# Patient Record
Sex: Female | Born: 1980 | Race: White | Hispanic: No | Marital: Married | Smoking: Never smoker
Health system: Southern US, Community
[De-identification: ages and names within clinical notes are randomized; demographics above are authoritative.]

---

## 2005-11-19 ENCOUNTER — Other Ambulatory Visit: Admission: RE | Admit: 2005-11-19 | Discharge: 2005-11-19 | Payer: Self-pay | Admitting: Obstetrics and Gynecology

## 2007-12-02 ENCOUNTER — Inpatient Hospital Stay (HOSPITAL_COMMUNITY): Admission: AD | Admit: 2007-12-02 | Discharge: 2007-12-04 | Payer: Self-pay | Admitting: Obstetrics and Gynecology

## 2010-08-20 ENCOUNTER — Ambulatory Visit (INDEPENDENT_AMBULATORY_CARE_PROVIDER_SITE_OTHER): Payer: 59

## 2010-08-20 ENCOUNTER — Inpatient Hospital Stay (INDEPENDENT_AMBULATORY_CARE_PROVIDER_SITE_OTHER)
Admission: RE | Admit: 2010-08-20 | Discharge: 2010-08-20 | Disposition: A | Discharge status: Home / Self Care | Payer: 59 | Source: Ambulatory Visit | Attending: Family Medicine | Admitting: Family Medicine

## 2010-08-20 DIAGNOSIS — S93409A Sprain of unspecified ligament of unspecified ankle, initial encounter: Secondary | ICD-10-CM

## 2012-08-31 IMAGING — CR DG FOOT COMPLETE 3+V*L*
3 series · 3 of 3 positions shown · non-contrast
Comparison: None.

CLINICAL DATA: Left foot pain.

LEFT FOOT - COMPLETE 3+ VIEW

[view not recorded (1 of 3)]
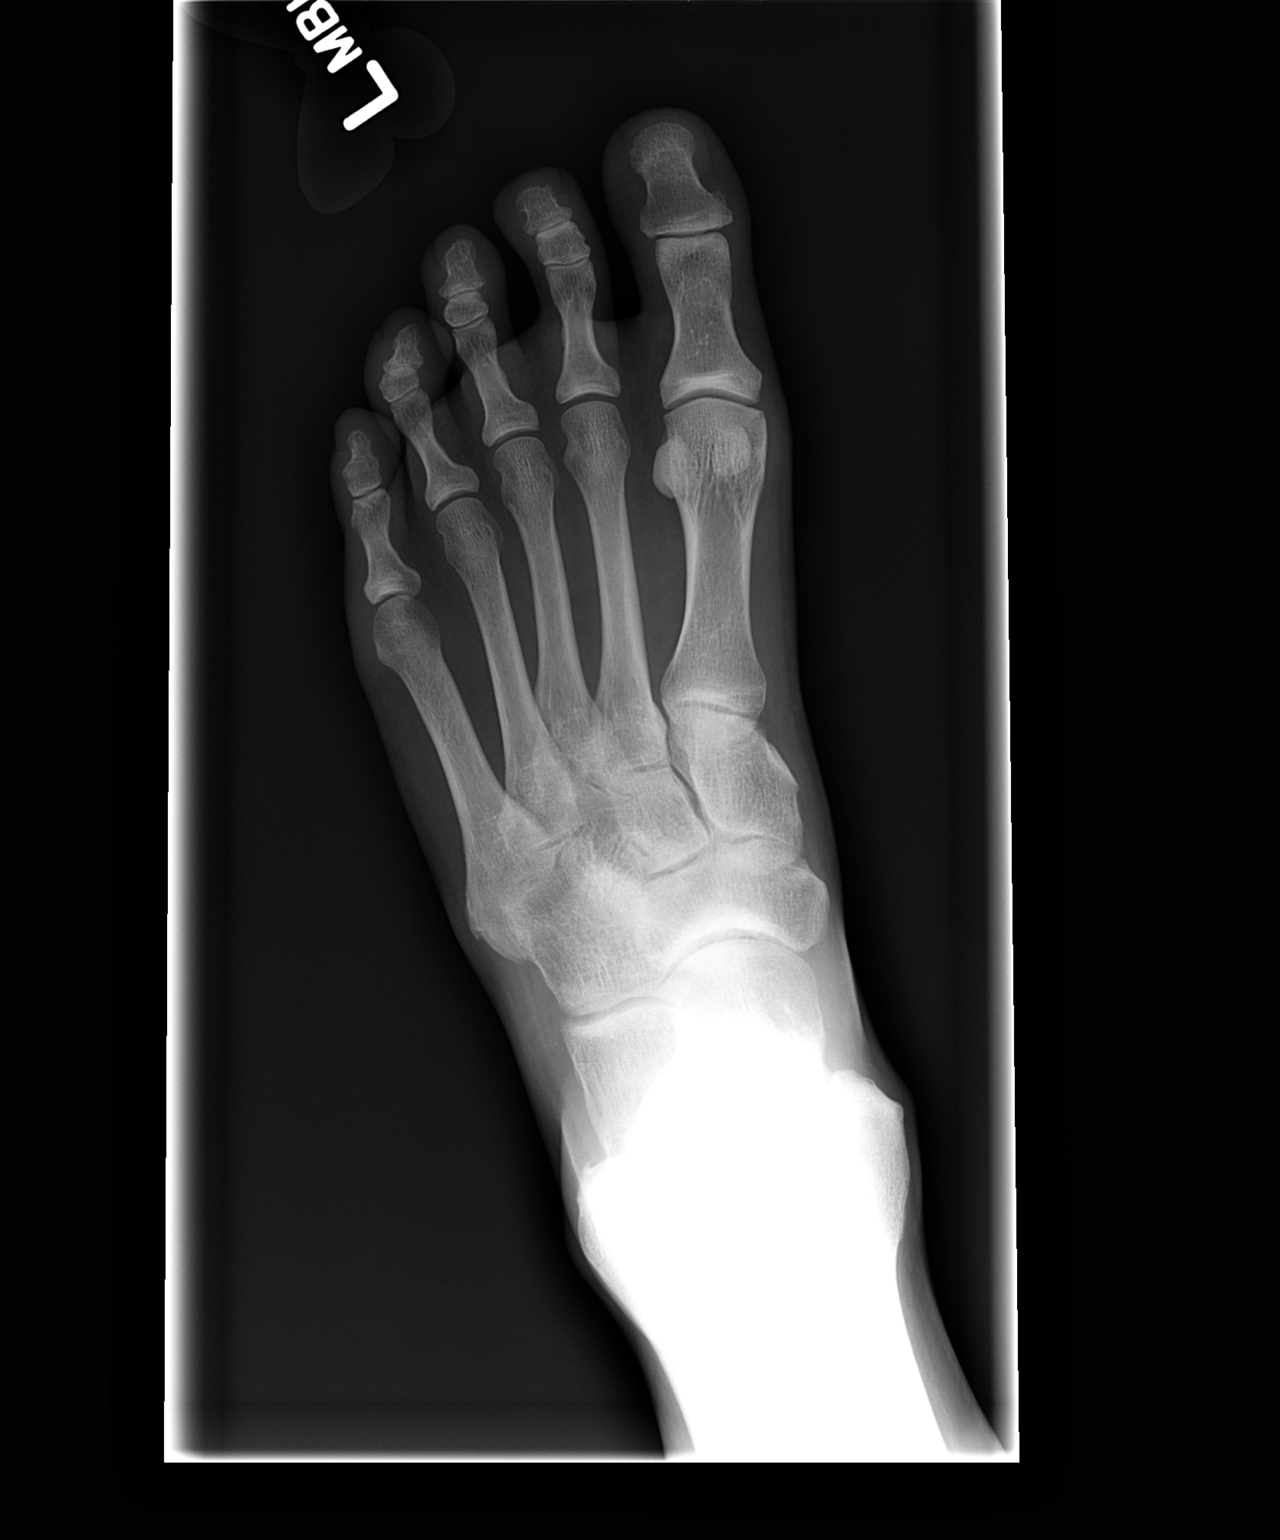

[view not recorded (2 of 3)]
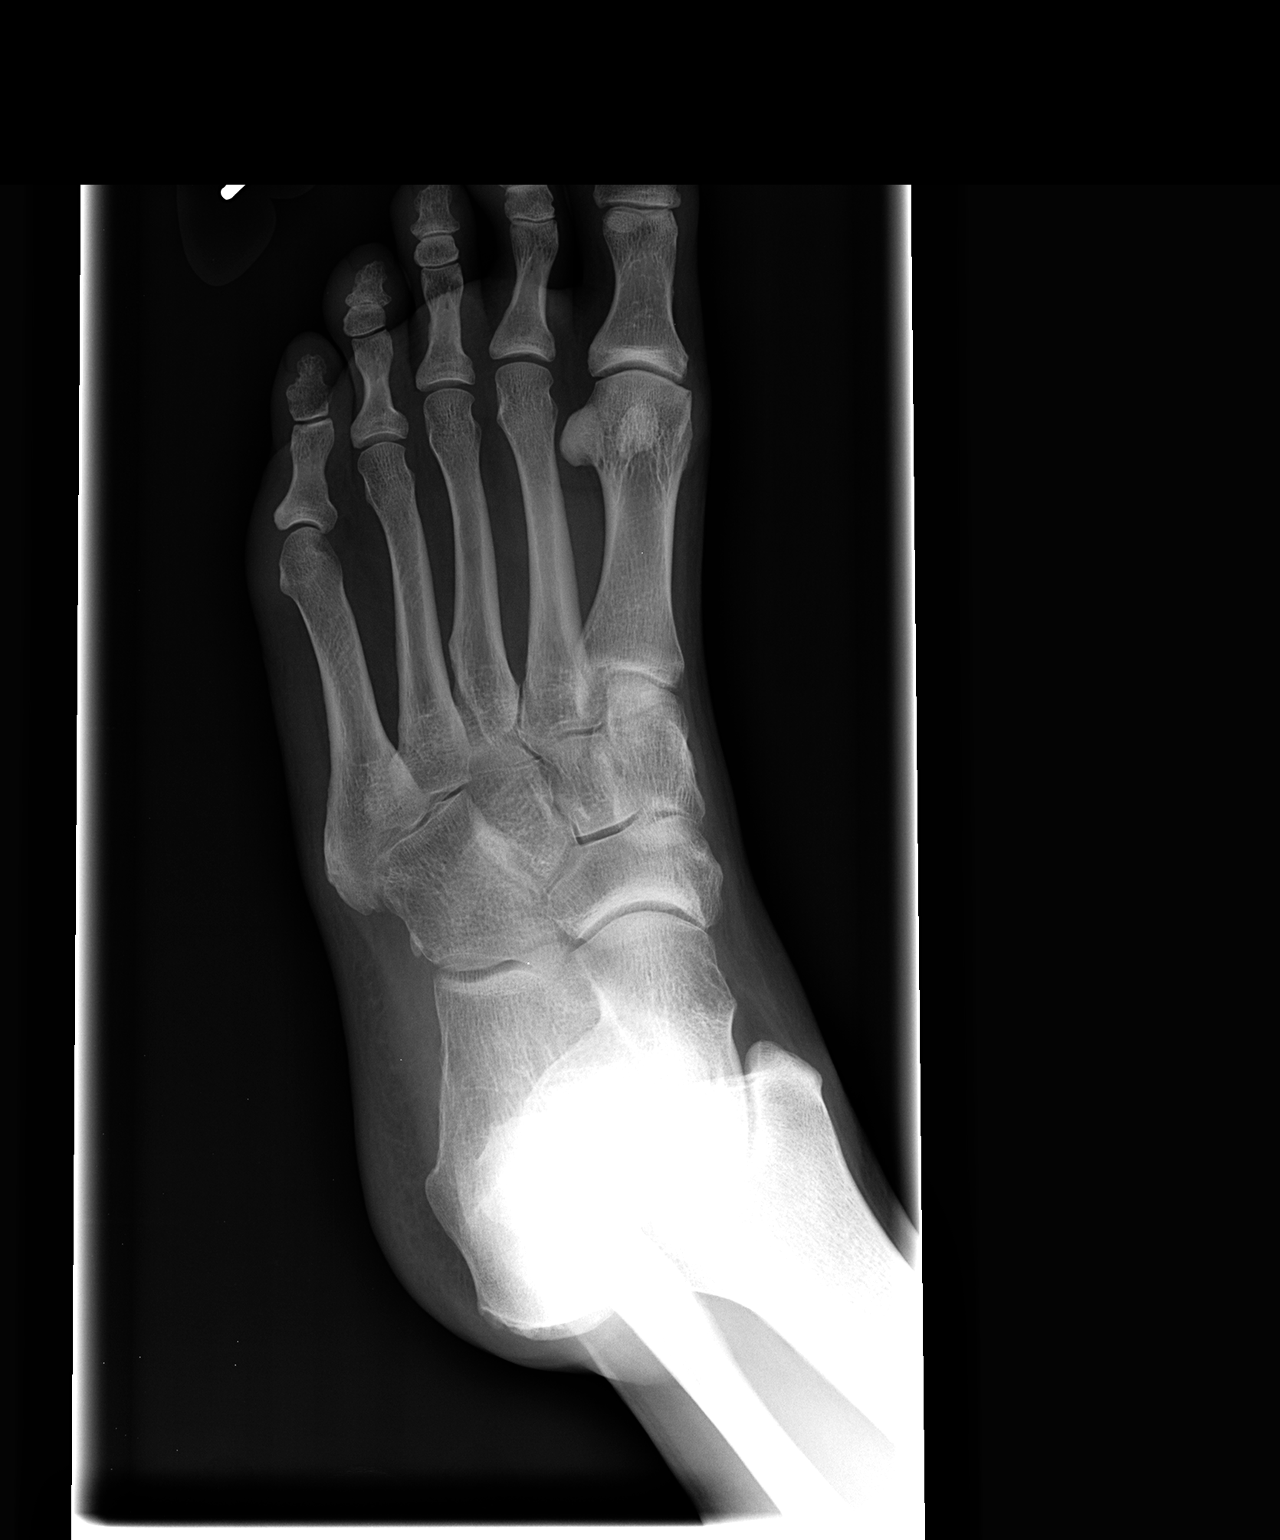

[view not recorded (3 of 3)]
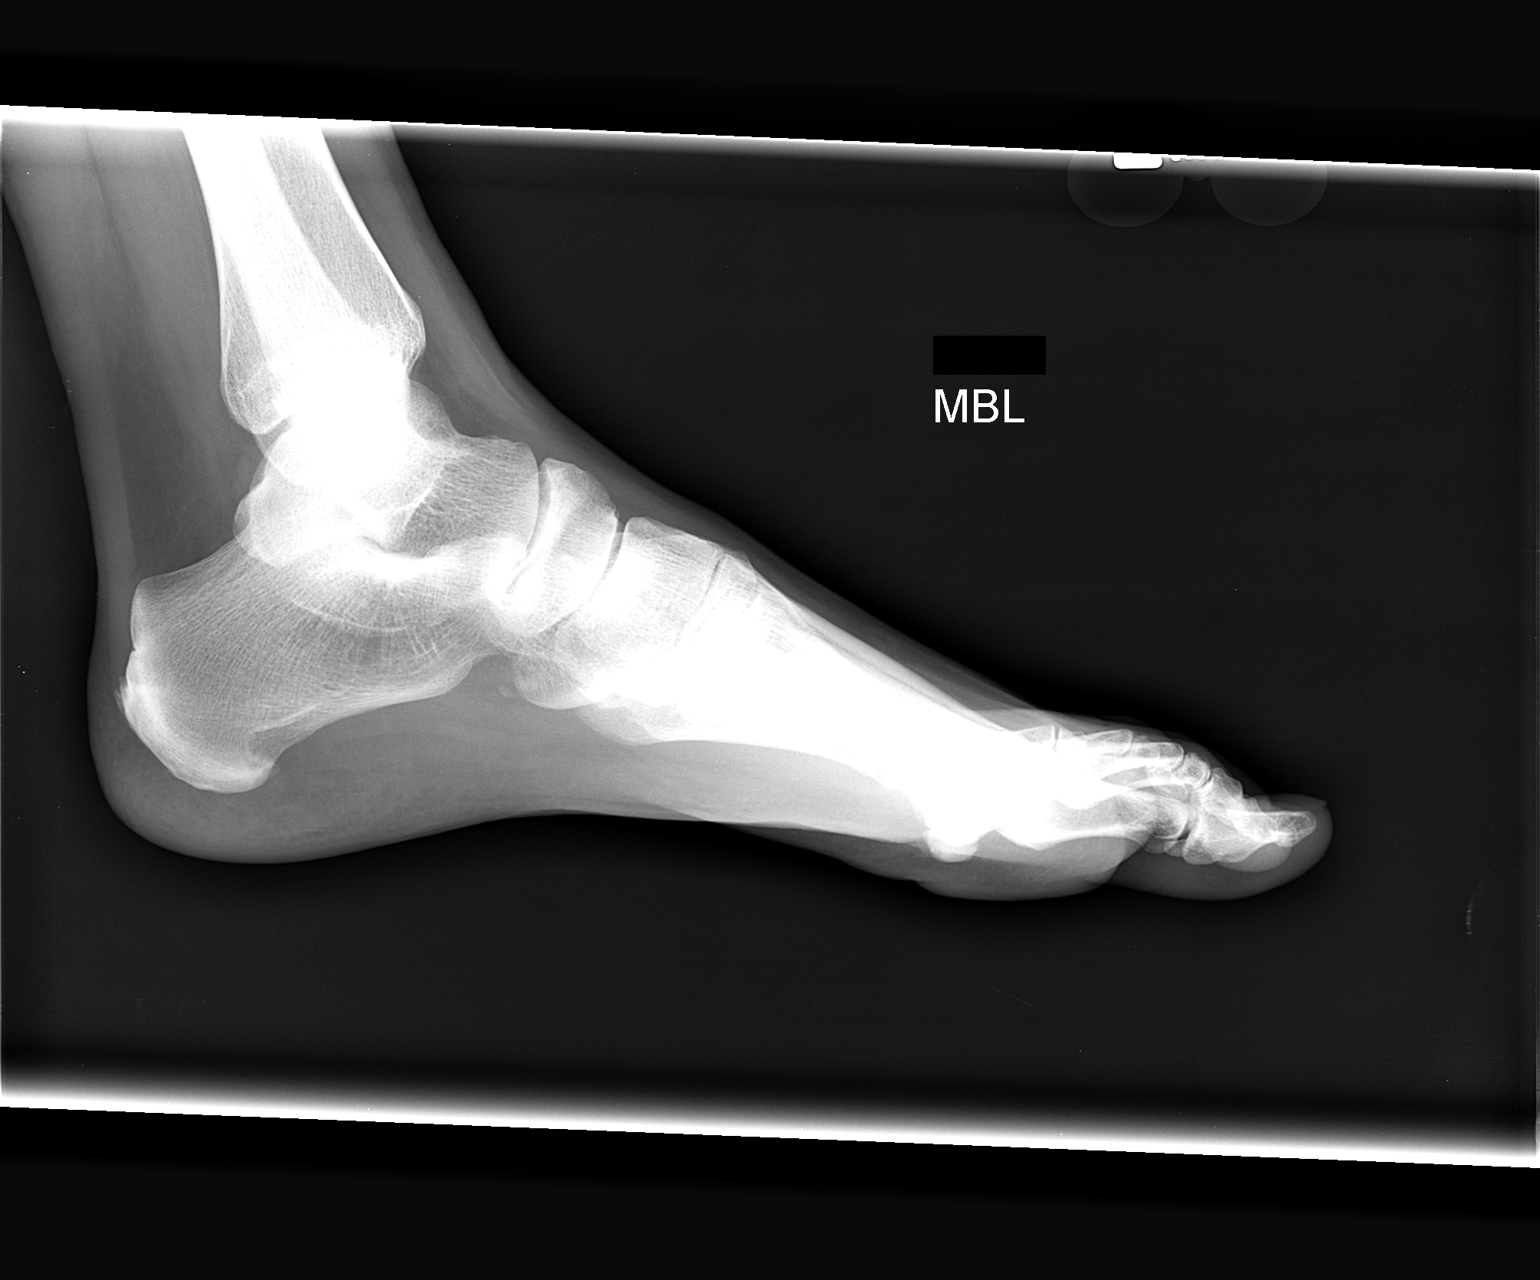

[3 of 3 positions shown; findings below may reference images not displayed]

FINDINGS: The joint spaces are maintained.  No acute fracture.  No
degenerative changes.
IMPRESSION: No acute bony findings.

## 2012-11-06 ENCOUNTER — Other Ambulatory Visit (HOSPITAL_COMMUNITY)
Admission: RE | Admit: 2012-11-06 | Discharge: 2012-11-06 | Disposition: A | Discharge status: Home / Self Care | Payer: Medicaid Other | Source: Ambulatory Visit | Attending: Family Medicine | Admitting: Family Medicine

## 2012-11-06 ENCOUNTER — Other Ambulatory Visit: Payer: Self-pay | Admitting: Physician Assistant

## 2012-11-06 DIAGNOSIS — Z0000 Encounter for general adult medical examination without abnormal findings: Secondary | ICD-10-CM | POA: Insufficient documentation

## 2012-11-06 DIAGNOSIS — R102 Pelvic and perineal pain: Secondary | ICD-10-CM

## 2012-11-11 ENCOUNTER — Ambulatory Visit
Admission: RE | Admit: 2012-11-11 | Discharge: 2012-11-11 | Disposition: A | Discharge status: Home / Self Care | Payer: Medicaid Other | Source: Ambulatory Visit | Attending: Physician Assistant | Admitting: Physician Assistant

## 2012-11-11 DIAGNOSIS — R102 Pelvic and perineal pain: Secondary | ICD-10-CM

## 2019-04-22 ENCOUNTER — Ambulatory Visit: Payer: Self-pay | Attending: Internal Medicine

## 2019-04-22 DIAGNOSIS — Z20822 Contact with and (suspected) exposure to covid-19: Secondary | ICD-10-CM | POA: Insufficient documentation

## 2019-04-23 LAB — NOVEL CORONAVIRUS, NAA: SARS-CoV-2, NAA: NOT DETECTED

## 2020-10-04 ENCOUNTER — Other Ambulatory Visit: Payer: Self-pay

## 2020-10-04 ENCOUNTER — Encounter: Payer: Self-pay | Admitting: Emergency Medicine

## 2020-10-04 ENCOUNTER — Ambulatory Visit
Admission: EM | Admit: 2020-10-04 | Discharge: 2020-10-04 | Disposition: A | Discharge status: Home / Self Care | Payer: Self-pay | Attending: Emergency Medicine | Admitting: Emergency Medicine

## 2020-10-04 DIAGNOSIS — U071 COVID-19: Secondary | ICD-10-CM

## 2020-10-04 DIAGNOSIS — R509 Fever, unspecified: Secondary | ICD-10-CM

## 2020-10-04 DIAGNOSIS — R059 Cough, unspecified: Secondary | ICD-10-CM

## 2020-10-04 MED ORDER — MOLNUPIRAVIR EUA 200MG CAPSULE: 4.0000 | ORAL_CAPSULE | Freq: Two times a day (BID) | ORAL | 0 refills | Status: AC

## 2020-10-04 MED ORDER — PREDNISONE 10 MG (21) PO TBPK: ORAL_TABLET | Freq: Every day | ORAL | 0 refills | Status: AC

## 2020-10-04 MED ORDER — ONDANSETRON HCL 4 MG PO TABS: 4.0000 mg | ORAL_TABLET | Freq: Four times a day (QID) | ORAL | 0 refills | Status: AC

## 2020-10-04 MED ORDER — ALBUTEROL SULFATE HFA 108 (90 BASE) MCG/ACT IN AERS: 1.0000 | INHALATION_SPRAY | Freq: Four times a day (QID) | RESPIRATORY_TRACT | 0 refills | Status: AC | PRN

## 2020-10-04 NOTE — ED Triage Notes (Signed)
Pt here for cough, congestion and vomiting x 4 days; pt sts positive covid test

## 2020-10-04 NOTE — ED Provider Notes (Signed)
[facility]    CSN: [id number] Arrival date & time: 10/04/20  1241      History   Chief Complaint Chief Complaint  Patient presents with   Cough   Vomiting    HPI Elizabeth Parks is a 40 y.o. female.   Pt tested positive for covid on sun, states she feels bad, coughing phlem, body aches, n/v/ fever. Not able to keep motrin down. Has take otc meds with no relief. No chest pain, some sob.  Pt has taken JJ vaccine    History reviewed. No pertinent past medical history.  There are no problems to display for this patient.   History reviewed. No pertinent surgical history.  OB History   No obstetric history on file.      Home Medications    Prior to Admission medications   Not on File    Family History History reviewed. No pertinent family history.  Social History     Allergies   Patient has no known allergies.   Review of Systems Review of Systems  Constitutional:  Positive for activity change, chills and fever.  HENT:  Positive for sore throat.   Respiratory:  Positive for cough and shortness of breath.   Cardiovascular: Negative.   Gastrointestinal:  Positive for nausea and vomiting.  Genitourinary: Negative.   Musculoskeletal:        Generalized body aches   Neurological:  Positive for weakness and light-headedness.    Physical Exam Triage Vital Signs ED Triage Vitals [10/04/20 1351]  Enc Vitals Group     BP 122/70     Pulse Rate 90     Resp 18     Temp 99.1 F (37.3 C)     Temp Source Oral     SpO2 96 %     Weight      Height      Head Circumference      Peak Flow      Pain Score 7     Pain Loc      Pain Edu?      Excl. in GC?    No data found.  Updated Vital Signs BP 122/70 (BP Location: Left Arm)   Pulse 90   Temp 99.1 F (37.3 C) (Oral)   Resp 18   SpO2 96%   Visual Acuity     Physical Exam Constitutional:      Appearance: She is ill-appearing.  Cardiovascular:     Rate and Rhythm: Tachycardia present.   Pulmonary:     Breath sounds: Rhonchi present.  Abdominal:     General: Abdomen is flat.  Musculoskeletal:        General: Normal range of motion.     Cervical back: Normal range of motion.  Skin:    General: Skin is warm.     Capillary Refill: Capillary refill takes less than 2 seconds.  Neurological:     General: No focal deficit present.     Mental Status: She is alert.     UC Treatments / Results  Labs (all labs ordered are listed, but only abnormal results are displayed) Labs Reviewed - No data to display  EKG   Radiology No results found.  Procedures Procedures (including critical care time)  Medications Ordered in UC Medications - No data to display  Initial Impression / Assessment and Plan / UC Course  I have reviewed the triage vital signs and the nursing notes.  Pertinent labs & imaging results that were available  during my care of the patient were reviewed by me and considered in my medical decision making (see chart for details).     Take medications as prescribed  Stay hydrated  If symptoms become worse go to the er  Quarantine for at least 5 days   Final Clinical Impressions(s) / UC Diagnoses   Final diagnoses:  None   Discharge Instructions   None    ED Prescriptions   None    PDMP not reviewed this encounter.   Coralyn Mark, NP 10/04/20 1534

## 2021-12-12 ENCOUNTER — Other Ambulatory Visit: Payer: Self-pay

## 2021-12-12 ENCOUNTER — Ambulatory Visit (HOSPITAL_COMMUNITY)
Admission: EM | Admit: 2021-12-12 | Discharge: 2021-12-12 | Disposition: A | Discharge status: Home / Self Care | Payer: Self-pay | Attending: Internal Medicine | Admitting: Internal Medicine

## 2021-12-12 ENCOUNTER — Encounter (HOSPITAL_COMMUNITY): Payer: Self-pay | Admitting: Emergency Medicine

## 2021-12-12 DIAGNOSIS — R0789 Other chest pain: Secondary | ICD-10-CM

## 2021-12-12 NOTE — ED Triage Notes (Signed)
Pt reports intermittent chest tightness, shortness of breath and nausea x 3 days. States have tried taking an antacid today with no relief. States on Sunday morning woke up with a headache and nausea similar to feeling like a hangover.

## 2021-12-12 NOTE — ED Provider Notes (Signed)
[facility]    CSN: [id number] Arrival date & time: 12/12/21  1733      History   Chief Complaint Chief Complaint  Patient presents with   Shortness of Breath   Nausea   Intermittent Chest Tightness     HPI Elizabeth Parks is a 41 y.o. female.  Presents with 3-day history of intermittent chest tightness, "shortness of breath" Overall fatigue Denies any symptoms while in clinic currently  No URI symptoms or fever  Tried antacid for symptoms without relief Does report some epigastric discomfort when laying down at night after eating.  No history of asthma or COPD, no cardiac history No PE risk factors   She is traveling tomorrow and just wanted to be seen before going.  History reviewed. No pertinent past medical history.  There are no problems to display for this patient.   History reviewed. No pertinent surgical history.  OB History   No obstetric history on file.      Home Medications    Prior to Admission medications   Medication Sig Start Date End Date Taking? Authorizing Provider  albuterol (VENTOLIN HFA) 108 (90 Base) MCG/ACT inhaler Inhale 1-2 puffs into the lungs every 6 (six) hours as needed for wheezing or shortness of breath. 10/04/20   Coralyn Mark, NP  ondansetron (ZOFRAN) 4 MG tablet Take 1 tablet (4 mg total) by mouth every 6 (six) hours. 10/04/20   Coralyn Mark, NP  predniSONE (STERAPRED UNI-PAK 21 TAB) 10 MG (21) TBPK tablet Take by mouth daily. Take 6 tabs by mouth daily  for 2 days, then 5 tabs for 2 days, then 4 tabs for 2 days, then 3 tabs for 2 days, 2 tabs for 2 days, then 1 tab by mouth daily for 2 days 10/04/20   Coralyn Mark, NP    Family History History reviewed. No pertinent family history.  Social History Social History   Tobacco Use   Smoking status: Never   Smokeless tobacco: Never     Allergies   Morphine   Review of Systems Review of Systems Per HPI  Physical Exam Triage Vital  Signs ED Triage Vitals  Enc Vitals Group     BP 12/12/21 1834 126/81     Pulse Rate 12/12/21 1834 84     Resp 12/12/21 1834 16     Temp 12/12/21 1834 98.3 F (36.8 C)     Temp Source 12/12/21 1834 Oral     SpO2 12/12/21 1834 98 %     Weight --      Height --      Head Circumference --      Peak Flow --      Pain Score 12/12/21 1831 4     Pain Loc --      Pain Edu? --      Excl. in GC? --    No data found.  Updated Vital Signs BP 126/81 (BP Location: Right Arm)   Pulse 84   Temp 98.3 F (36.8 C) (Oral)   Resp 16   SpO2 98%   Physical Exam Vitals and nursing note reviewed.  Constitutional:      General: She is not in acute distress.    Appearance: She is well-developed.  HENT:     Head: Normocephalic and atraumatic.     Mouth/Throat:     Pharynx: Oropharynx is clear.  Eyes:     Conjunctiva/sclera: Conjunctivae normal.  Cardiovascular:     Rate and Rhythm:  Normal rate and regular rhythm.     Pulses: Normal pulses.     Heart sounds: Normal heart sounds.  Pulmonary:     Effort: Pulmonary effort is normal. No respiratory distress.     Breath sounds: Normal breath sounds.  Chest:     Chest wall: No tenderness.  Abdominal:     Palpations: Abdomen is soft.     Tenderness: There is no abdominal tenderness. There is no guarding.  Musculoskeletal:        General: No swelling.     Cervical back: Neck supple.  Skin:    General: Skin is warm and dry.     Capillary Refill: Capillary refill takes less than 2 seconds.  Neurological:     Mental Status: She is alert.  Psychiatric:        Mood and Affect: Mood normal.      UC Treatments / Results  Labs (all labs ordered are listed, but only abnormal results are displayed) Labs Reviewed - No data to display  EKG  Radiology No results found.  Procedures Procedures   Medications Ordered in UC Medications - No data to display  Initial Impression / Assessment and Plan / UC Course  I have reviewed the triage  vital signs and the nursing notes.  Pertinent labs & imaging results that were available during my care of the patient were reviewed by me and considered in my medical decision making (see chart for details).  EKG obtained normal sinus rhythm with ventricular rate of 72 bpm, no ischemic changes. After discussion about shortness of breath sensation, patient declines feeling this way.  She has not had any trouble breathing whatsoever. No acute distress. Patient does not have symptoms in clinic. She is breathing normally with clear lungs. Reports chest tightness feels like her reflux symptoms sometimes. Recommend follow up with PCP regarding symptoms, but strict ED precautions for any chest pain or trouble breathing. Return precautions discussed. Patient agrees to plan  Final Clinical Impressions(s) / UC Diagnoses   Final diagnoses:  Chest wall discomfort     Discharge Instructions      Please follow-up with a primary care provider as soon as you are able to establish.  Please go right to the emergency department if symptoms worsen.     ED Prescriptions   None    PDMP not reviewed this encounter.   Julia Alkhatib, Ray Church 12/12/21 2113

## 2021-12-12 NOTE — Discharge Instructions (Signed)
Please follow-up with a primary care provider as soon as you are able to establish.  Please go right to the emergency department if symptoms worsen.

## 2021-12-14 ENCOUNTER — Encounter: Payer: Self-pay | Admitting: Emergency Medicine

## 2022-11-26 ENCOUNTER — Emergency Department (HOSPITAL_COMMUNITY)
Admission: EM | Admit: 2022-11-26 | Discharge: 2022-11-27 | Disposition: A | Discharge status: Home / Self Care | Payer: Self-pay | Attending: Emergency Medicine | Admitting: Emergency Medicine

## 2022-11-26 DIAGNOSIS — M546 Pain in thoracic spine: Secondary | ICD-10-CM | POA: Insufficient documentation

## 2022-11-26 DIAGNOSIS — R079 Chest pain, unspecified: Secondary | ICD-10-CM | POA: Insufficient documentation

## 2022-11-26 DIAGNOSIS — R0602 Shortness of breath: Secondary | ICD-10-CM | POA: Insufficient documentation

## 2022-11-26 DIAGNOSIS — R0789 Other chest pain: Secondary | ICD-10-CM

## 2022-11-27 ENCOUNTER — Encounter (HOSPITAL_COMMUNITY): Payer: Self-pay | Admitting: Emergency Medicine

## 2022-11-27 ENCOUNTER — Emergency Department (HOSPITAL_COMMUNITY): Payer: Self-pay

## 2022-11-27 ENCOUNTER — Other Ambulatory Visit: Payer: Self-pay

## 2022-11-27 LAB — BASIC METABOLIC PANEL
Anion gap: 7 (ref 5–15)
BUN: 9 mg/dL (ref 6–20)
CO2: 25 mmol/L (ref 22–32)
Calcium: 8.7 mg/dL — ABNORMAL LOW (ref 8.9–10.3)
Chloride: 106 mmol/L (ref 98–111)
Creatinine, Ser: 0.85 mg/dL (ref 0.44–1.00)
GFR, Estimated: >60 mL/min (ref >60)
Glucose, Bld: 99 mg/dL (ref 70–99)
Potassium: 3.8 mmol/L (ref 3.5–5.1)
Sodium: 138 mmol/L (ref 135–145)

## 2022-11-27 LAB — CBC
HCT: 38.5 % (ref 36.0–46.0)
Hemoglobin: 12.4 g/dL (ref 12.0–15.0)
MCH: 28.6 pg (ref 26.0–34.0)
MCHC: 32.2 g/dL (ref 30.0–36.0)
MCV: 88.9 fL (ref 80.0–100.0)
Platelets: 426 10*3/uL — ABNORMAL HIGH (ref 150–400)
RBC: 4.33 MIL/uL (ref 3.87–5.11)
RDW: 13.7 % (ref 11.5–15.5)
WBC: 13.3 10*3/uL — ABNORMAL HIGH (ref 4.0–10.5)
nRBC: 0 % (ref 0.0–0.2)

## 2022-11-27 LAB — HCG, SERUM, QUALITATIVE: Preg, Serum: NEGATIVE

## 2022-11-27 LAB — TROPONIN I (HIGH SENSITIVITY): Troponin I (High Sensitivity): <2 ng/L (ref <18)

## 2022-11-27 NOTE — ED Notes (Signed)
Save blue tube in main lab °

## 2022-11-27 NOTE — ED Triage Notes (Signed)
Patient coming to ED for evaluation of chest pain and shortness of breath.  Describes pain as shoulder pain, but points to chest.  Having shortness of breath with movement. States pain started a couple of days ago.  Worsened today.  No fever or cough

## 2022-11-27 NOTE — ED Provider Notes (Signed)
[place] EMERGENCY DEPARTMENT AT [facility] Provider Note  CSN: [id number] Arrival date & time: 11/26/22 2340  Chief Complaint(s) Chest Pain  HPI Elizabeth Parks is a 42 y.o. female with no pertinent past medical history who presents to the emergency department for several days of intermittent left shoulder girdle muscle pain that began in the periscapular region and is now transition to the left upper chest.  It has been intermittent and increasing worse and mostly yesterday.  Worse with certain positions.  Alleviated by Motrin and changing positions.  No recent fevers or infections.  No coughing or congestion.  No nausea or vomiting.  Patient does endorse mild shortness of breath.  No lower extremity swelling.  No prior DVT/PEs, no OCP/estrogen use.  No history of cancer or autoimmune disorder.  No recent immobilization.  Pain is not exertional and nonradiating.  She is currently a non-smoker as of 4 years ago.  The history is provided by the patient.    Past Medical History History reviewed. No pertinent past medical history. There are no problems to display for this patient.  Home Medication(s) Prior to Admission medications   Medication Sig Start Date End Date Taking? Authorizing Provider  albuterol (VENTOLIN HFA) 108 (90 Base) MCG/ACT inhaler Inhale 1-2 puffs into the lungs every 6 (six) hours as needed for wheezing or shortness of breath. 10/04/20   Coralyn Mark, NP  ondansetron (ZOFRAN) 4 MG tablet Take 1 tablet (4 mg total) by mouth every 6 (six) hours. 10/04/20   Coralyn Mark, NP  predniSONE (STERAPRED UNI-PAK 21 TAB) 10 MG (21) TBPK tablet Take by mouth daily. Take 6 tabs by mouth daily  for 2 days, then 5 tabs for 2 days, then 4 tabs for 2 days, then 3 tabs for 2 days, 2 tabs for 2 days, then 1 tab by mouth daily for 2 days 10/04/20   Coralyn Mark, NP                                                                                                                                     Allergies Morphine  Review of Systems Review of Systems As noted in HPI  Physical Exam Vital Signs  I have reviewed the triage vital signs BP 115/69 (BP Location: Right Arm)   Pulse 70   Temp 98 F (36.7 C) (Oral)   Resp 16   Ht 5\' 6"  (1.676 m)   Wt 78.8 kg   SpO2 97%   BMI 28.05 kg/m   Physical Exam Vitals reviewed.  Constitutional:      General: She is not in acute distress.    Appearance: She is well-developed. She is not diaphoretic.  HENT:     Head: Normocephalic and atraumatic.     Nose: Nose normal.  Eyes:     General: No scleral icterus.       Right eye: No discharge.  Left eye: No discharge.     Conjunctiva/sclera: Conjunctivae normal.     Pupils: Pupils are equal, round, and reactive to light.  Cardiovascular:     Rate and Rhythm: Normal rate and regular rhythm.     Heart sounds: No murmur heard.    No friction rub. No gallop.  Pulmonary:     Effort: Pulmonary effort is normal. No respiratory distress.     Breath sounds: Normal breath sounds. No stridor. No rales.  Abdominal:     General: There is no distension.     Palpations: Abdomen is soft.     Tenderness: There is no abdominal tenderness.  Musculoskeletal:     Cervical back: Normal range of motion and neck supple.     Thoracic back: Tenderness present.       Back:  Skin:    General: Skin is warm and dry.     Findings: No erythema or rash.  Neurological:     Mental Status: She is alert and oriented to person, place, and time.     ED Results and Treatments Labs (all labs ordered are listed, but only abnormal results are displayed) Labs Reviewed  BASIC METABOLIC PANEL - Abnormal; Notable for the following components:      Result Value   Calcium 8.7 (*)    All other components within normal limits  CBC - Abnormal; Notable for the following components:   WBC 13.3 (*)    Platelets 426 (*)    All other components within normal limits  HCG, SERUM,  QUALITATIVE  TROPONIN I (HIGH SENSITIVITY)                                                                                                                         EKG  EKG Interpretation Date/Time:  Tuesday November 27 2022 00:12:34 EDT Ventricular Rate:  65 PR Interval:  172 QRS Duration:  79 QT Interval:  414 QTC Calculation: 431 R Axis:   50  Text Interpretation: Sinus rhythm Confirmed by Drema Pry [phone]) on 11/27/2022 5:29:51 AM       Radiology DG Chest 2 View  Result Date: 11/27/2022 CLINICAL DATA:  Chest pain with worsening shortness of breath x2 weeks EXAM: CHEST - 2 VIEW COMPARISON:  None Available. FINDINGS: The heart size and mediastinal contours are within normal limits. Both lungs are clear. The visualized skeletal structures are unremarkable. There are artifacts from overlying clothing and monitor wiring. IMPRESSION: No evidence of acute chest disease. Electronically Signed   By: Almira Bar M.D.   On: 11/27/2022 00:53    Medications Ordered in ED Medications - No data to display Procedures Procedures  (including critical care time) Medical Decision Making / ED Course   Medical Decision Making Amount and/or Complexity of Data Reviewed Labs: ordered. Radiology: ordered.    Chest pain differential considered  Most likely consistent with muscle strain/spasm of the left shoulder girdle musculature. Possible GI etiology. Atypical for ACS.  EKG without acute  ischemic changes or evidence of pericarditis.  Troponin more than 6 hours after onset negative.  Feel this is sufficient to rule out ACS. Low pretest probability for PE and she is PERC negative. CBC with leukocytosis of unknown etiology.  No anemia. Metabolic panel without significant electrolyte derangement or renal insufficiency hCG to assist in guiding care negative. Chest x-ray without evidence of pneumonia, pneumothorax, pulmonary edema pleural effusion. No widened mediastinum concerning for  dissection.      Final Clinical Impression(s) / ED Diagnoses Final diagnoses:  Intermittent left-sided chest pain   The patient appears reasonably screened and/or stabilized for discharge and I doubt any other medical condition or other [facility] requiring further screening, evaluation, or treatment in the ED at this time. I have discussed the findings, Dx and Tx plan with the patient/family who expressed understanding and agree(s) with the plan. Discharge instructions discussed at length. The patient/family was given strict return precautions who verbalized understanding of the instructions. No further questions at time of discharge.  Disposition: Discharge  Condition: Good  ED Discharge Orders     None        Follow Up: [facility] [address] [place] [address] [phone]  Call  to schedule an appointment for close follow up    This chart was dictated using voice recognition software.  Despite best efforts to proofread,  errors can occur which can change the documentation meaning.    Nira Conn, MD 11/27/22 [phone]
